# Patient Record
Sex: Male | Born: 1980 | Race: White | Hispanic: Yes | Marital: Single | Smoking: Never smoker
Health system: Southern US, Community
[De-identification: ages and names within clinical notes are randomized; demographics above are authoritative.]

---

## 2004-05-12 ENCOUNTER — Emergency Department (HOSPITAL_COMMUNITY): Admission: EM | Admit: 2004-05-12 | Discharge: 2004-05-12 | Payer: Self-pay | Admitting: Emergency Medicine

## 2014-10-27 ENCOUNTER — Ambulatory Visit (INDEPENDENT_AMBULATORY_CARE_PROVIDER_SITE_OTHER): Payer: Self-pay | Admitting: Emergency Medicine

## 2014-10-27 VITALS — BP 120/86 | HR 77 | Temp 98.1°F | Resp 17 | Ht 65.25 in | Wt 162.0 lb

## 2014-10-27 DIAGNOSIS — R21 Rash and other nonspecific skin eruption: Secondary | ICD-10-CM

## 2014-10-27 LAB — GLUCOSE, POCT (MANUAL RESULT ENTRY): POC Glucose: 103 mg/dl — AB (ref 70–99)

## 2014-10-27 MED ORDER — CLOTRIMAZOLE 1 % EX CREA: 1.0000 "application " | TOPICAL_CREAM | Freq: Two times a day (BID) | CUTANEOUS | Status: DC

## 2014-10-27 MED ORDER — CETIRIZINE HCL 10 MG PO TABS: 10.0000 mg | ORAL_TABLET | Freq: Every day | ORAL | Status: DC

## 2014-10-27 NOTE — Patient Instructions (Signed)
Prurito en la Ingle (Jock Itch) Prurito en la ingle es una infeccin por hongos en la piel de la ingle. A veces se lo denomina "culebrilla" pero no proviene de un gusano. Un hongo es un tipo de germen que crece en lugares oscuros y hmedos.  CAUSAS La infeccin podr diseminarse:  Por infeccin de hongos en cualquier parte del cuerpo (como el pie de atleta).  Compartir toallas o ropa. Esta infeccin es ms frecuente en:  Climas clidos y hmedos.  Personas que Lao People's Democratic Republicutilizan ropa Indonesiaajustada o trajes de bao hmedos por Con-waymucho tiempo.  Deportistas.  Personas con sobrepeso.  Personas con diabtes. SNTOMAS Prurito en la ingle causa los siguientes sntomas:  Puntos rojos, rosados o marrones en la ingle. Puede expandirse a los muslos, nalgas y ano.  Picazn. DIAGNSTICO El profesional hace el diagnstico a travs de la observacin de la erupcin. A veces se realiza un raspado de la piel para realizar un anlisis. El anlisis se obtiene mirando el microscopio o mediante un cultivo (prueba para Therapist, musichacer crecer el hongo). Este puede tomar Liberty Mutualhasta dos semanas en reaparecer. TRATAMIENTO Prurito en la ingle puede tratarse:  Cremas para la piel o pomadas para matar hongos.  Medicamentos por va oral que destruyen los hongos.  Cremas para la piel o pomadas para calmar la picazn.  Compresas o polvos con medicamentos para secar la piel infectada. INSTRUCCIONES PARA EL CUIDADO DOMICILIARIO  Asegrese de que la erupcin desaparezca completamente con el tratamiento. Siga las indicaciones del mdico. Puede llevar un par de semanas completar la curacin. Si no se trata durante el tiempo suficiente, esta lesin Metallurgistpuede reaparecer.  Use ropas sueltas.  Los hombres deben usar boxers de algodn cortos.  Las mujeres deben usar ropa interior de algodn.  Evite los baos calientes.  Seque la zona de la ingle luego del bao. SOLICITE ANTENCIN MDICA SI:  La erupcin empeora.  Se extiende.  Aparece  nuevamente luego de completar el tratamiento.  No se cura en el trmino de 4 semanas. Las infecciones micticas son lentas en responder al TEFL teachertratamiento. Persiste un enrojecimiento durante varias semanas despus de que el hongo haya desaparecido. SOLICITE ATENCIN MDICA DE INMEDIATO SI:  La zona se vuelve roja, caliente, se hincha o duele.  Tiene fiebre. Document Released: 06/20/2005 Document Revised: 12/03/2011 [facility] Center[facility]Exit[facility] Patient Information 2015 QuartzsiteExitCare, MarylandLLC. This information is not intended to replace advice given to you by your health care provider. Make sure you discuss any questions you have with your health care provider.

## 2014-10-27 NOTE — Progress Notes (Signed)
   Subjective:    Patient ID: Kevin Contreras, male    DOB: 09/29/1980, 34 y.o.   MRN: [id number] This chart was scribed for Kevin ChrisSteven Ortha Metts, MD by Littie Deedsichard Sun, Medical Scribe. This patient was seen in room 5 and the patient's care was started at 11:28 AM.   HPI HPI Comments: Kevin Elkngel Guillen Phegley is a 34 y.o. Spanish-speaking male who presents to the [facility] complaining of an itching rash to his scrotum that started 1 week ago. He has not tried any medications. He denies sore throat. Patient denies having similar rash before. He is unsure if he has DM.    Review of Systems  HENT: Negative for sore throat.   Skin: Positive for rash.       Objective:   Physical Exam CONSTITUTIONAL: Well developed/well nourished HEAD: Normocephalic/atraumatic EYES: EOM/PERRL ENMT: Mucous membranes moist NECK: supple no meningeal signs SPINE: entire spine nontender CV: S1/S2 noted, no murmurs/rubs/gallops noted LUNGS: Lungs are clear to auscultation bilaterally, no apparent distress ABDOMEN: soft, nontender, no rebound or guarding GU: Mild redness of the scrotal sac. Testes are normal. No discharge or adenopathy. NEURO: Pt is awake/alert, moves all extremitiesx4 EXTREMITIES: pulses normal, full ROM SKIN: warm, color normal PSYCH: no abnormalities of mood noted Results for orders placed or performed in visit on 10/27/14  POCT glucose (manual entry)  Result Value Ref Range   POC Glucose 103 (A) 70 - 99 mg/dl         Assessment & Plan:  Patient having a lot of itching with redness of the scrotum. Will treat with Zyrtec and Lotrimin cream. He is not diabetic. This most likely represents a form of tinea cruris.I personally performed the services described in this documentation, which was scribed in my presence. The recorded information has been reviewed and is accurate.

## 2015-08-28 ENCOUNTER — Emergency Department (HOSPITAL_COMMUNITY)
Admission: EM | Admit: 2015-08-28 | Discharge: 2015-08-28 | Disposition: A | Discharge status: Home / Self Care | Payer: Self-pay | Attending: Emergency Medicine | Admitting: Emergency Medicine

## 2015-08-28 ENCOUNTER — Other Ambulatory Visit: Payer: Self-pay

## 2015-08-28 ENCOUNTER — Emergency Department (HOSPITAL_COMMUNITY): Payer: Self-pay

## 2015-08-28 ENCOUNTER — Encounter (HOSPITAL_COMMUNITY): Payer: Self-pay | Admitting: Emergency Medicine

## 2015-08-28 DIAGNOSIS — R42 Dizziness and giddiness: Secondary | ICD-10-CM | POA: Insufficient documentation

## 2015-08-28 DIAGNOSIS — R079 Chest pain, unspecified: Secondary | ICD-10-CM | POA: Insufficient documentation

## 2015-08-28 DIAGNOSIS — R110 Nausea: Secondary | ICD-10-CM | POA: Insufficient documentation

## 2015-08-28 LAB — CBC
HCT: 47.9 % (ref 39.0–52.0)
Hemoglobin: 17 g/dL (ref 13.0–17.0)
MCH: 30.8 pg (ref 26.0–34.0)
MCHC: 35.5 g/dL (ref 30.0–36.0)
MCV: 86.8 fL (ref 78.0–100.0)
PLATELETS: 305 10*3/uL (ref 150–400)
RBC: 5.52 MIL/uL (ref 4.22–5.81)
RDW: 12 % (ref 11.5–15.5)
WBC: 7.9 10*3/uL (ref 4.0–10.5)

## 2015-08-28 LAB — I-STAT TROPONIN, ED: TROPONIN I, POC: 0 ng/mL (ref 0.00–0.08)

## 2015-08-28 LAB — BASIC METABOLIC PANEL
Anion gap: 6 (ref 5–15)
BUN: 8 mg/dL (ref 6–20)
CALCIUM: 9.4 mg/dL (ref 8.9–10.3)
CHLORIDE: 102 mmol/L (ref 101–111)
CO2: 31 mmol/L (ref 22–32)
CREATININE: 0.93 mg/dL (ref 0.61–1.24)
Glucose, Bld: 161 mg/dL — ABNORMAL HIGH (ref 65–99)
Potassium: 3.4 mmol/L — ABNORMAL LOW (ref 3.5–5.1)
SODIUM: 139 mmol/L (ref 135–145)

## 2015-08-28 MED ORDER — GI COCKTAIL ~~LOC~~
30.0000 mL | Freq: Once | ORAL | Status: AC
  Administered 2015-08-28: 30 mL via ORAL
  Filled 2015-08-28: qty 30

## 2015-08-28 MED ORDER — IOHEXOL 350 MG/ML SOLN
100.0000 mL | Freq: Once | INTRAVENOUS | Status: AC | PRN
  Administered 2015-08-28: 100 mL via INTRAVENOUS

## 2015-08-28 NOTE — ED Provider Notes (Signed)
CSN: [id number]     Arrival date & time 08/28/15  2054 History   First MD Initiated Contact with Patient 08/28/15 2111     Chief Complaint  Patient presents with  . Chest Pain     (Consider location/radiation/quality/duration/timing/severity/associated sxs/prior Treatment) Patient is a 34 y.o. male presenting with chest pain. The history is provided by the patient.  Chest Pain Pain location:  Epigastric Pain quality: sharp   Pain radiates to:  Does not radiate Pain radiates to the back: no   Pain severity:  Severe Onset quality:  Gradual Duration:  2 days Timing:  Intermittent Progression:  Waxing and waning Chronicity:  New Context: eating   Relieved by:  Nothing Worsened by:  Nothing tried Ineffective treatments:  None tried Associated symptoms: dizziness and nausea   Associated symptoms: no abdominal pain, no fever, no headache, no palpitations, no shortness of breath and not vomiting   Risk factors: male sex   Risk factors: no coronary artery disease, no diabetes mellitus, no high cholesterol, no hypertension and no smoking     34 yo M with a chief complaint of substernal chest pain. This started on Monday. Patient states it's intermittent. Some times associated with food. Patient also has it happened usually on exertion. Gets very dizzy weak with it. Patient has never had any symptoms like this before. Patient does not normally see a doctor. Denies any medical history. No family history of MI.  History reviewed. No pertinent past medical history. History reviewed. No pertinent past surgical history. No family history on file. Social History  Substance Use Topics  . Smoking status: Never Smoker   . Smokeless tobacco: None  . Alcohol Use: No    Review of Systems  Constitutional: Negative for fever and chills.  HENT: Negative for congestion and facial swelling.   Eyes: Negative for discharge and visual disturbance.  Respiratory: Negative for shortness of breath.    Cardiovascular: Positive for chest pain. Negative for palpitations.  Gastrointestinal: Positive for nausea. Negative for vomiting, abdominal pain and diarrhea.  Musculoskeletal: Negative for myalgias and arthralgias.  Skin: Negative for color change and rash.  Neurological: Positive for dizziness. Negative for tremors, syncope and headaches.  Psychiatric/Behavioral: Negative for confusion and dysphoric mood.      Allergies  Review of patient's allergies indicates no known allergies.  Home Medications   Prior to Admission medications   Medication Sig Start Date End Date Taking? Authorizing Provider  cetirizine (ZYRTEC) 10 MG tablet Take 1 tablet (10 mg total) by mouth daily. Patient not taking: Reported on 08/28/2015 10/27/14   Collene Gobble, MD  clotrimazole (LOTRIMIN) 1 % cream Apply 1 application topically 2 (two) times daily. Patient not taking: Reported on 08/28/2015 10/27/14   Collene Gobble, MD   BP 136/82 mmHg  Pulse 87  Temp(Src) 98 F (36.7 C) (Oral)  Resp 17  SpO2 100% Physical Exam  Constitutional: He is oriented to person, place, and time. He appears well-developed and well-nourished.  HENT:  Head: Normocephalic and atraumatic.  Eyes: EOM are normal. Pupils are equal, round, and reactive to light.  Neck: Normal range of motion. Neck supple. No JVD present.  Cardiovascular: Normal rate, regular rhythm and intact distal pulses.  Exam reveals no gallop and no friction rub.   No murmur heard. Pulmonary/Chest: No respiratory distress. He has no wheezes.  Abdominal: He exhibits no distension. There is no tenderness. There is no rebound and no guarding.  Musculoskeletal: Normal range of motion.  Neurological: He is alert and oriented to person, place, and time.  Skin: No rash noted. No pallor.  Psychiatric: He has a normal mood and affect. His behavior is normal.  Nursing note and vitals reviewed.   ED Course  Procedures (including critical care time) Labs Review Labs  Reviewed  BASIC METABOLIC PANEL - Abnormal; Notable for the following:    Potassium 3.4 (*)    Glucose, Bld 161 (*)    All other components within normal limits  CBC  I-STAT TROPOININ, ED    Imaging Review Dg Chest 2 View  08/28/2015  CLINICAL DATA:  Centralized chest pain tonight. EXAM: CHEST  2 VIEW COMPARISON:  None. FINDINGS: Cardiomediastinal silhouette is normal. The lungs are clear without pleural effusions or focal consolidations. Trachea projects midline and there is no pneumothorax. Soft tissue planes and included osseous structures are non-suspicious. IMPRESSION: Normal chest. Electronically Signed   By: Courtnay  Bloomer M.D.   On: 08/28/2015 21:27   Ct Angio Chest Pe W/cm &/or Wo Cm  08/28/2015  CLINICAL DATA:  Acute onset of intermittent central chest pain and mild shortness of breath. Initial encounter. EXAM: CT ANGIOGRAPHY CHEST WITH CONTRAST TECHNIQUE: Multidetector CT imaging of the chest was performed using the standard protocol during bolus administration of intravenous contrast. Multiplanar CT image reconstructions and MIPs were obtained to evaluate the vascular anatomy.Technical [id number] KitTechnical [id number] KitcH. C. Watkins Memorial Technical bCind551-4575AdvanMarland KitcMary Bridge Children'S Hospital ATechnical bCind60471AdvanMarland KitcWeisman Childrens RehabilitatiTechnical [phone] KitcMunicipal Hosp & Granite ManorTechnical bCind386-0842AdvanMarland KitcPark City MediTechnical bCind53175AdvanMarland KitcBoundary Community Hospitalhen6Huston FoleSw ElmoreTechnicalWillapa Harbor HoTechnical [phone] KitcSt Marys Ambulatory Surgery Centerhen2HuTechnical bCind475-8224AdvanMarland KitcNorth Central Bronx Hospitalhen6[place] FTechnical [phone] KitcSt Mary MerTechnical [phone] KitcUnasource Technical bCind909-113AdvanMarland KitcBeacon Behavioral Hospital NorthshoTechnical [phone] KitcProvident HospiTechnical [phone] KitcVa Medical Center - Fort MeadTechnical [phone] KitcCape Surgery CTechnical [phone] KitcEl Dorado STechnical [phone] KitcOceans Behavioral Hospital Of The Permian Basinhen(6HTechnical bCind(364TechnicalFTechnical bCind636-428AdvanMarland KitcWest Paces MedicaTechnical [phone] KitcCenter For Orthopedic Surgery LLChen(2Huston FoleSwa13moiKore01308(MBecton, Dickinson and CompanyDonatMarykayJessDupage Eye Surgery Center p.m. FINDINGS: There is no evidence of pulmonary embolus. The lungs are grossly clear bilaterally. There is no evidence of significant focal consolidation, pleural effusion or pneumothorax. No masses are identified; no abnormal focal contrast enhancement is seen. The mediastinum is unremarkable appearance. No mediastinal lymphadenopathy is seen. No pericardial effusion is identified. The great vessels are grossly unremarkable. No axillary lymphadenopathy is seen. The thyroid gland is unremarkable in appearance. The visualized portions of the liver and spleen are unremarkable. No acute osseous abnormalities are seen. Review of the MIP images confirms the above findings. IMPRESSION: 1. No evidence of  pulmonary embolus. 2. Lungs clear bilaterally. Electronically Signed   By: Jeffery  Chang M.D.   On: 08/28/2015 22:27   I have personally reviewed and evaluated these images and lab results as part of my medical decision-making.  ED ECG REPORT   Date: 08/28/2015  Rate: 94  Rhythm: normal sinus rhythm  QRS Axis: right  Intervals: normal  ST/T Wave abnormalities: normal  Conduction Disutrbances:none  Narrative Interpretation:   Old EKG Reviewed: none available  I have personally reviewed the EKG tracing and agree with the computerized printout as noted.  MDM   Final diagnoses:  Chest pain, unspecified chest pain type    34 yo M with a chief complaint of chest pain. This been going on for the past 4 days. Pain is intermittent and not constant. Due to leg which barrier possible limitation to history. Patient had transient tachycardia walls in the room with some symptoms consistent with pulmonary embolism CT angios ordered. Feel that patient is unlikely to have an MI due to age, with normal ecg, normal cxr and negative trop ordered from triage.   11:04 PM:  I have discussed the diagnosis/risks/treatment options with the patient and family and believe the pt to be eligible for discharge home to follow-up with PCP. We also discussed returning to the ED immediately if new or worsening sx occur. We discussed the sx which are most concerning (e.g., sudden worsening sob, chest pain, syncope) that  necessitate immediate return. Medications administered to the patient during their visit and any new prescriptions provided to the patient are listed below.  Medications given during this visit Medications  iohexol (OMNIPAQUE) 350 MG/ML injection 100 mL (100 mLs Intravenous Contrast Given 08/28/15 2207)  gi cocktail (Maalox,Lidocaine,Donnatal) (30 mLs Oral Given 08/28/15 2243)    Discharge Medication List as of 08/28/2015 10:37 PM      The patient appears reasonably screen and/or stabilized for  discharge and I doubt any other medical condition or other [facility] requiring further screening, evaluation, or treatment in the ED at this time prior to discharge.      Melene Plan, DO 08/28/15 2304

## 2015-08-28 NOTE — Discharge Instructions (Signed)
Try zantac twice a day.  Dolor de pecho inespecfico  (Nonspecific Chest Pain) El dolor de pecho puede deberse a muchas enfermedades diferentes. Siempre existe una posibilidad de que el dolor est relacionado con algo grave, como un infarto de miocardio o un cogulo sanguneo en los pulmones. Hay muchas enfermedades que no son potencialmente mortales que pueden causar dolor de Onargapecho. Si tiene Engineer, miningdolor de Hotel managerpecho, es muy importante que se controle con el mdico. CAUSAS  Las causas del dolor de pecho pueden ser las siguientes:  Acidez estomacal.  Neumona o bronquitis.  Ansiedad o estrs.  Inflamacin de la zona que rodea al corazn (pericarditis) o a los pulmones (pleuritis o pleuresa).  Un cogulo sanguneo en el pulmn.  Colapso de un pulmn (neumotrax), que puede aparecer de Regions Financial Corporationmanera repentina por s solo (neumotrax espontneo) o debido a un traumatismo en el trax.  Culebrilla (virus de la varicela zster).  Infarto de miocardio.  Dao de los Oberlinhuesos, los msculos y los cartlagos que conforman la pared torcica. Esto puede incluir lo siguiente:  Hematomas seos debido a lesiones.  Distensiones musculares o de los cartlagos por tos frecuente o repetida, o por exceso de trabajo.  Fractura de una o ms costillas.  Dolor de TEFL teachercartlago debido a inflamacin (costocondritis). FACTORES DE RIESGO  Los factores de riesgo de tener dolor de pecho pueden incluir lo siguiente:  Actividades que incrementan el riesgo de sufrir traumatismos o lesiones en el trax.  Infecciones o enfermedades respiratorias que causan tos frecuente.  Enfermedades o Eastman Kodakexcesos en las comidas que pueden causar Engineering geologistacidez.  Enfermedades cardacas o antecedentes familiares de enfermedades cardacas.  Enfermedades o comportamientos de salud que aumentan el riesgo de tener un cogulo sanguneo.  Haber tenido varicela (varicela zster). SIGNOS Y SNTOMAS El dolor de pecho puede provocar las siguientes  sensaciones:  Ardor u hormigueo en la superficie o en lo profundo del pecho.  Dolor opresivo, continuo o constrictivo.  Dolor vago o intenso que empeora al Clorox Companymoverse, toser o inhalar profundamente.  Dolor que tambin se siente en la espalda, el cuello, el hombro o el brazo, o dolor que se irradia a cualquiera de estas zonas. El dolor de pecho puede aparecer y Geneticist, moleculardesaparecer, o bien puede ser constante. DIAGNSTICO Gretchen ShortQuizs se necesiten anlisis de laboratorio u otros estudios para Veterinary surgeonencontrar la causa del Engineer, miningdolor. El mdico puede indicarle que se haga una prueba llamada EGC (electrocadiograma) ambulatorio. El Regulatory affairs officerelectrocardiograma registra los patrones de los latidos cardacos en el momento en que se realiza el Red Bluffestudio. Tambin pueden hacerle otros estudios, por ejemplo:  Ecocardiograma transtorcico (ETT). Durante el ecocardiograma, se usan ondas sonoras para crear una imagen de todas las estructuras cardacas y evaluar cmo circula la sangre por el corazn.  Ecocardiograma transesofgico (ETE).Este es un estudio de diagnstico por imgenes ms avanzado que el obtiene imgenes del interior del cuerpo. Le permite al mdico ver el corazn con mayor detalle.  Monitoreo cardaco. Permite que el mdico controle la frecuencia y el ritmo cardaco en tiempo real.  Monitor Holter. Es un dispositivo porttil que eBayregistra los latidos del corazn y puede ayudar a Education administratordiagnosticar las arritmias cardacas. Le permite al American Expressmdico registrar la actividad cardaca durante varios das, si es necesario.  Pruebas de esfuerzo. Estas pueden realizarse durante el ejercicio o mediante la administracin de un medicamento que acelera los latidos del corazn.  Anlisis de Twiningsangre.  Diagnstico por imgenes. TRATAMIENTO  El tratamiento depende de la causa del dolor de Fincastlepecho. El tratamiento puede incluir lo siguiente:  Medicamentos.  Estos pueden incluir lo siguiente:  Inhibidores de Engineer, petroleum.  Antiinflamatorios.  Analgsicos para las enfermedades inflamatorias.  Antibiticos, si hay una infeccin.  Medicamentos para [employer].  Medicamentos para tratar la enfermedad arterial coronaria.  Tratamiento complementario para las enfermedades que no requieren la toma de medicamentos. Esto puede incluir lo siguiente:  Descansar.  Aplicar compresas fras o calientes en las zonas lesionadas.  Limitar las actividades hasta que Erie Insurance Group. INSTRUCCIONES PARA EL CUIDADO EN EL HOGAR  Si le recetaron antibiticos, asegrese de terminarlos, incluso si comienza a sentirse mejor.  Evite las SUPERVALU INC causen dolor de Oracle.  No consuma ningn producto que contenga tabaco, lo que incluye cigarrillos, tabaco de Theatre manager o Administrator, Civil Service. Si necesita ayuda para dejar de fumar, consulte al mdico.  No beba alcohol.  Tome los medicamentos solamente como se lo haya indicado el mdico.  Concurra a todas las visitas de control como se lo haya indicado el mdico. Esto es importante. Esto incluye otros estudios si el dolor de pecho no desaparece.  Si la acidez es la causa del dolor de [place], tal vez le aconsejen que mantenga la cabeza levantada (elevada) mientras duerme. Esto reduce la probabilidad de que el cido retroceda del estmago al esfago.  Haga cambios en su estilo de vida como se lo haya indicado el mdico. Estos pueden incluir lo siguiente:  Education administrator actividad fsica con regularidad. Pida al mdico que le sugiera algunas actividades que sean seguras para usted.  Consumir una dieta cardiosaludable. Un nutricionista matriculado puede ayudarlo a Software engineer saludables.  Mantener un peso saludable.  Controlar la diabetes, si es necesario.  Reducir las situaciones de estrs. SOLICITE ATENCIN MDICA SI:  El dolor de pecho no desaparece despus del tratamiento.  Tiene una erupcin cutnea con ampollas en el  pecho.  Tiene fiebre. SOLICITE ATENCIN MDICA DE INMEDIATO SI:   El dolor en el pecho es ms intenso.  La tos empeora, o expectora sangre.  Siente un dolor abdominal intenso.  Siente debilidad intensa.  Se desmaya.  Tiene escalofros.  Tiene una molestia repentina e inexplicable en el pecho.  Tiene molestias repentinas e Exxon Mobil Corporation, la espalda, el cuello o la [place].  Le falta el aire en cualquier momento.  Comienza a sudar de [place] repentina o la piel se le humedece.  Siente nuseas o vomita.  Se siente repentinamente mareado o se desmaya.  Siente que el corazn comienza a latir rpidamente o que se saltea latidos. Estos sntomas pueden representar un problema grave que constituye Radio broadcast assistant. No espere hasta que los sntomas desaparezcan. Solicite atencin mdica de inmediato. Comunquese con el servicio de emergencias de su localidad (911 en los [place]). No conduzca por sus propios medios OfficeMax Incorporated.   Esta informacin no tiene Theme park manager el consejo del mdico. Asegrese de hacerle al mdico cualquier pregunta que tenga.   Document Released: 09/10/2005 Document Revised: 10/01/2014 Elsevier Interactive Patient Education [employer]

## 2015-08-28 NOTE — ED Notes (Signed)
Pt. reports intermittent central chest pain onset Monday with mild SOB , denies nausea or diaphoresis , no cough or congestion .

## 2017-03-07 ENCOUNTER — Encounter (HOSPITAL_COMMUNITY): Payer: Self-pay | Admitting: *Deleted

## 2017-03-07 ENCOUNTER — Other Ambulatory Visit: Payer: Self-pay

## 2017-03-07 ENCOUNTER — Emergency Department (HOSPITAL_COMMUNITY)
Admission: EM | Admit: 2017-03-07 | Discharge: 2017-03-07 | Disposition: A | Discharge status: Home / Self Care | Payer: Self-pay | Attending: Emergency Medicine | Admitting: Emergency Medicine

## 2017-03-07 ENCOUNTER — Emergency Department (HOSPITAL_COMMUNITY): Payer: Self-pay

## 2017-03-07 DIAGNOSIS — M940 Chondrocostal junction syndrome [Tietze]: Secondary | ICD-10-CM | POA: Insufficient documentation

## 2017-03-07 DIAGNOSIS — R0789 Other chest pain: Secondary | ICD-10-CM | POA: Insufficient documentation

## 2017-03-07 DIAGNOSIS — Z79899 Other long term (current) drug therapy: Secondary | ICD-10-CM | POA: Insufficient documentation

## 2017-03-07 LAB — BASIC METABOLIC PANEL
ANION GAP: 11 (ref 5–15)
BUN: 9 mg/dL (ref 6–20)
CO2: 24 mmol/L (ref 22–32)
Calcium: 9.2 mg/dL (ref 8.9–10.3)
Chloride: 104 mmol/L (ref 101–111)
Creatinine, Ser: 1.06 mg/dL (ref 0.61–1.24)
GFR calc Af Amer: >60 mL/min (ref >60)
GLUCOSE: 135 mg/dL — AB (ref 65–99)
POTASSIUM: 3.3 mmol/L — AB (ref 3.5–5.1)
Sodium: 139 mmol/L (ref 135–145)

## 2017-03-07 LAB — D-DIMER, QUANTITATIVE (NOT AT ARMC)

## 2017-03-07 LAB — CBC
HEMATOCRIT: 42.7 % (ref 39.0–52.0)
HEMOGLOBIN: 14.9 g/dL (ref 13.0–17.0)
MCH: 30.4 pg (ref 26.0–34.0)
MCHC: 34.9 g/dL (ref 30.0–36.0)
MCV: 87.1 fL (ref 78.0–100.0)
Platelets: 292 10*3/uL (ref 150–400)
RBC: 4.9 MIL/uL (ref 4.22–5.81)
RDW: 12.4 % (ref 11.5–15.5)
WBC: 9.5 10*3/uL (ref 4.0–10.5)

## 2017-03-07 LAB — I-STAT TROPONIN, ED: TROPONIN I, POC: 0 ng/mL (ref 0.00–0.08)

## 2017-03-07 LAB — TROPONIN I: Troponin I: <0.03 ng/mL (ref <0.03)

## 2017-03-07 MED ORDER — LORAZEPAM 2 MG/ML IJ SOLN
1.0000 mg | Freq: Once | INTRAMUSCULAR | Status: AC
  Administered 2017-03-07: 1 mg via INTRAVENOUS
  Filled 2017-03-07: qty 1

## 2017-03-07 MED ORDER — SODIUM CHLORIDE 0.9 % IV BOLUS (SEPSIS)
1000.0000 mL | Freq: Once | INTRAVENOUS | Status: AC
  Administered 2017-03-07: 1000 mL via INTRAVENOUS

## 2017-03-07 MED ORDER — CYCLOBENZAPRINE HCL 10 MG PO TABS: 10.0000 mg | ORAL_TABLET | Freq: Three times a day (TID) | ORAL | 0 refills | Status: DC | PRN

## 2017-03-07 MED ORDER — NAPROXEN 500 MG PO TABS
500.0000 mg | ORAL_TABLET | Freq: Two times a day (BID) | ORAL | 0 refills | Status: AC
Start: 2017-03-07 — End: 2017-03-14

## 2017-03-07 MED ORDER — HYDROCODONE-ACETAMINOPHEN 5-325 MG PO TABS: 1.0000 | ORAL_TABLET | ORAL | 0 refills | Status: DC | PRN

## 2017-03-07 MED ORDER — KETOROLAC TROMETHAMINE 15 MG/ML IJ SOLN
15.0000 mg | Freq: Once | INTRAMUSCULAR | Status: AC
  Administered 2017-03-07: 15 mg via INTRAVENOUS
  Filled 2017-03-07: qty 1

## 2017-03-07 NOTE — ED Triage Notes (Signed)
The pt is c/o central chest pain for one year  He has been here several times for the same and he reports they never gave me nothing    No other symptoms associated with this pain

## 2017-03-07 NOTE — ED Notes (Signed)
Pt verbalized understanding discharge instructions and denies any further needs or questions at this time. VS stable, ambulatory and steady gait.   

## 2017-03-07 NOTE — ED Provider Notes (Signed)
[facility]-EMERGENCY DEPT Provider Note   CSN: [id number] Arrival date & time: 03/07/17  1920     History   Chief Complaint Chief Complaint  Patient presents with  . Chest Pain    HPI Kevin Contreras is a 36 y.o. male.  HPI   36 yo M here with left sided CP. Pt does frequent lifting/bending at work. He reports sharp, positional, left sided chest pain with associated "twinging" pain in his left chest. He has associated mild pain with deep inspiration but also pain with palpation. No SOB, nausea. No diaphoresis. No personal or family h/o DVT or PE. No leg swelling or recent surgeries. No other medical complaints.  History reviewed. No pertinent past medical history.  There are no active problems to display for this patient.   History reviewed. No pertinent surgical history.     Home Medications    Prior to Admission medications   Medication Sig Start Date End Date Taking? Authorizing Provider  cetirizine (ZYRTEC) 10 MG tablet Take 1 tablet (10 mg total) by mouth daily. Patient not taking: Reported on 03/07/2017 10/27/14   Collene Gobble, MD  clotrimazole (LOTRIMIN) 1 % cream Apply 1 application topically 2 (two) times daily. Patient not taking: Reported on 08/28/2015 10/27/14   Collene Gobble, MD  cyclobenzaprine (FLEXERIL) 10 MG tablet Take 1 tablet (10 mg total) by mouth 3 (three) times daily as needed for muscle spasms. 03/07/17   Shaune Pollack, MD  HYDROcodone-acetaminophen (NORCO/VICODIN) 5-325 MG tablet Take 1-2 tablets by mouth every 4 (four) hours as needed for moderate pain or severe pain. 03/07/17   Shaune Pollack, MD  naproxen (NAPROSYN) 500 MG tablet Take 1 tablet (500 mg total) by mouth 2 (two) times daily with a meal. 03/07/17 03/14/17  Shaune Pollack, MD    Family History No family history on file.  Social History Social History  Substance Use Topics  . Smoking status: Never Smoker  . Smokeless tobacco: Never Used  . Alcohol use No     Allergies     Patient has no known allergies.   Review of Systems Review of Systems  Constitutional: Negative for chills, fatigue and fever.  HENT: Negative for congestion and rhinorrhea.   Eyes: Negative for visual disturbance.  Respiratory: Negative for cough, shortness of breath and wheezing.   Cardiovascular: Positive for chest pain. Negative for leg swelling.  Gastrointestinal: Negative for abdominal pain, diarrhea, nausea and vomiting.  Genitourinary: Negative for dysuria and flank pain.  Musculoskeletal: Negative for neck pain and neck stiffness.  Skin: Negative for rash and wound.  Allergic/Immunologic: Negative for immunocompromised state.  Neurological: Negative for syncope, weakness and headaches.  All other systems reviewed and are negative.    Physical Exam Updated Vital Signs BP 119/69   Pulse 88   Temp 99.1 F (37.3 C) (Oral)   Resp 16   Ht 5\' 6"  (1.676 m)   Wt 72.6 kg (160 lb)   SpO2 99%   BMI 25.82 kg/m   Physical Exam  Constitutional: He is oriented to person, place, and time. He appears well-developed and well-nourished. No distress.  HENT:  Head: Normocephalic and atraumatic.  Eyes: Conjunctivae are normal.  Neck: Neck supple.  Cardiovascular: Normal rate, regular rhythm and normal heart sounds.  Exam reveals no friction rub.   No murmur heard. Pulmonary/Chest: Effort normal and breath sounds normal. No respiratory distress. He has no wheezes. He has no rales. He exhibits tenderness (significant pinpoint TTP over left upper chest intercostal  spaces).  Abdominal: He exhibits no distension.  Musculoskeletal: He exhibits no edema.  Neurological: He is alert and oriented to person, place, and time. He exhibits normal muscle tone.  Skin: Skin is warm. Capillary refill takes less than 2 seconds.  Psychiatric: He has a normal mood and affect.  Nursing note and vitals reviewed.    ED Treatments / Results  Labs (all labs ordered are listed, but only abnormal  results are displayed) Labs Reviewed  BASIC METABOLIC PANEL - Abnormal; Notable for the following:       Result Value   Potassium 3.3 (*)    Glucose, Bld 135 (*)    All other components within normal limits  CBC  TROPONIN I  D-DIMER, QUANTITATIVE (NOT AT [facility])  Rosezena SensorI-STAT TROPOININ, ED    EKG  EKG Interpretation  Date/Time:  Thursday March 07 2017 19:25:14 EDT Ventricular Rate:  109 PR Interval:  138 QRS Duration: 80 QT Interval:  324 QTC Calculation: 436 R Axis:   94 Text Interpretation:  Sinus tachycardia Possible Left atrial enlargement Rightward axis Borderline ECG No significant change since last tracing Confirmed by Shaune PollackIsaacs, Bralee Feldt [phone]) on 03/08/2017 2:47:19 PM       Radiology Dg Chest 2 View  Result Date: 03/07/2017 CLINICAL DATA:  Chest pain.  Shortness of breath. EXAM: CHEST  2 VIEW COMPARISON:  08/28/2015 FINDINGS: The heart size and mediastinal contours are within normal limits. Both lungs are clear. The visualized skeletal structures are unremarkable. IMPRESSION: Normal exam. Electronically Signed   By: Kennith CenterEric  Mansell M.D.   On: 03/07/2017 19:59    Procedures Procedures (including critical care time)  Medications Ordered in ED Medications  sodium chloride 0.9 % bolus 1,000 mL (0 mLs Intravenous Stopped 03/07/17 2339)  ketorolac (TORADOL) 15 MG/ML injection 15 mg (15 mg Intravenous Given 03/07/17 2231)  LORazepam (ATIVAN) injection 1 mg (1 mg Intravenous Given 03/07/17 2231)     Initial Impression / Assessment and Plan / ED Course  I have reviewed the triage vital signs and the nursing notes.  Pertinent labs & imaging results that were available during my care of the patient were reviewed by me and considered in my medical decision making (see chart for details).     36 yo M here with atypical, positional, sharp, reproducible left sided chest pain consistent with likely MSK chest wall pain/costochondritis. Trop neg x 2, EKG non-ischemic, and HEART score <3 - do  not suspect ACS. D-Dimer neg, no signs of hypoxia or DVT, doubt DVT/PE. Pain not c/w dissection or esophageal perforation. Will d/c with analgesia, anti-inflammatories, and outpt follow-up.  Final Clinical Impressions(s) / ED Diagnoses   Final diagnoses:  Atypical chest pain  Costochondritis    New Prescriptions Discharge Medication List as of 03/07/2017 11:20 PM    START taking these medications   Details  cyclobenzaprine (FLEXERIL) 10 MG tablet Take 1 tablet (10 mg total) by mouth 3 (three) times daily as needed for muscle spasms., Starting Thu 03/07/2017, Print    HYDROcodone-acetaminophen (NORCO/VICODIN) 5-325 MG tablet Take 1-2 tablets by mouth every 4 (four) hours as needed for moderate pain or severe pain., Starting Thu 03/07/2017, Print    naproxen (NAPROSYN) 500 MG tablet Take 1 tablet (500 mg total) by mouth 2 (two) times daily with a meal., Starting Thu 03/07/2017, Until Thu 03/14/2017, Print         Shaune PollackIsaacs, Mclean Moya, MD 03/08/17 [phone]

## 2017-06-14 ENCOUNTER — Emergency Department (HOSPITAL_COMMUNITY)
Admission: EM | Admit: 2017-06-14 | Discharge: 2017-06-15 | Disposition: A | Discharge status: Home / Self Care | Payer: Self-pay | Attending: Emergency Medicine | Admitting: Emergency Medicine

## 2017-06-14 ENCOUNTER — Emergency Department (HOSPITAL_COMMUNITY): Payer: Self-pay

## 2017-06-14 ENCOUNTER — Encounter (HOSPITAL_COMMUNITY): Payer: Self-pay | Admitting: Emergency Medicine

## 2017-06-14 DIAGNOSIS — R0789 Other chest pain: Secondary | ICD-10-CM | POA: Insufficient documentation

## 2017-06-14 DIAGNOSIS — R51 Headache: Secondary | ICD-10-CM | POA: Insufficient documentation

## 2017-06-14 DIAGNOSIS — R519 Headache, unspecified: Secondary | ICD-10-CM

## 2017-06-14 LAB — BASIC METABOLIC PANEL
Anion gap: 8 (ref 5–15)
BUN: 10 mg/dL (ref 6–20)
CHLORIDE: 100 mmol/L — AB (ref 101–111)
CO2: 27 mmol/L (ref 22–32)
CREATININE: 0.98 mg/dL (ref 0.61–1.24)
Calcium: 9.1 mg/dL (ref 8.9–10.3)
GFR calc non Af Amer: >60 mL/min (ref >60)
Glucose, Bld: 141 mg/dL — ABNORMAL HIGH (ref 65–99)
Potassium: 3.7 mmol/L (ref 3.5–5.1)
Sodium: 135 mmol/L (ref 135–145)

## 2017-06-14 LAB — POCT I-STAT TROPONIN I
Troponin i, poc: 0 ng/mL (ref 0.00–0.08)
Troponin i, poc: 0 ng/mL (ref 0.00–0.08)

## 2017-06-14 LAB — CBC
HCT: 44.2 % (ref 39.0–52.0)
Hemoglobin: 16.1 g/dL (ref 13.0–17.0)
MCH: 30.8 pg (ref 26.0–34.0)
MCHC: 36.4 g/dL — ABNORMAL HIGH (ref 30.0–36.0)
MCV: 84.5 fL (ref 78.0–100.0)
PLATELETS: 277 10*3/uL (ref 150–400)
RBC: 5.23 MIL/uL (ref 4.22–5.81)
RDW: 12.3 % (ref 11.5–15.5)
WBC: 7.4 10*3/uL (ref 4.0–10.5)

## 2017-06-14 MED ORDER — SODIUM CHLORIDE 0.9 % IV BOLUS (SEPSIS)
1000.0000 mL | Freq: Once | INTRAVENOUS | Status: AC
  Administered 2017-06-14: 1000 mL via INTRAVENOUS

## 2017-06-14 MED ORDER — METOCLOPRAMIDE HCL 5 MG/ML IJ SOLN
10.0000 mg | Freq: Once | INTRAMUSCULAR | Status: AC
  Administered 2017-06-14: 10 mg via INTRAVENOUS
  Filled 2017-06-14: qty 2

## 2017-06-14 MED ORDER — KETOROLAC TROMETHAMINE 30 MG/ML IJ SOLN
30.0000 mg | Freq: Once | INTRAMUSCULAR | Status: AC
  Administered 2017-06-14: 30 mg via INTRAVENOUS
  Filled 2017-06-14: qty 1

## 2017-06-14 NOTE — ED Provider Notes (Signed)
[facility]-EMERGENCY DEPT Provider Note   CSN: [id number] Arrival date & time: 06/14/17  1625     History   Chief Complaint Chief Complaint  Patient presents with  . Headache  . Chest Pain    HPI Kevin Contreras is a 36 y.o. male.  The history is provided by the patient. The history is limited by a language barrier. A language interpreter was used.  Headache   This is a new problem. The current episode started more than 2 days ago. The problem occurs constantly. The problem has not changed since onset.The headache is associated with nothing. The pain is located in the occipital region. The quality of the pain is described as dull and throbbing. The pain is moderate. Radiates to: neck to top of head. He has tried nothing for the symptoms.   36 year old male who presents with headache. Reports 1 week of waxing and waning posterior headache that radiates from the neck to the top of the head. Typically worse in the morning, occasionally associated with nausea but no vomiting. No vision, speech changes, difficulty walking, focal numbness or weakness. No fevers or chills, trauma or injury. 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 Has taken Flexeril for pain, but has not had improvement. Also notes that when pain is severe, in the morning, he occasionally has sharp throbbing left-sided chest pain that is worse with certain positions, and improves when he lifts his arm above his head. Last for a few minutes before going away. It is not associated with syncope or near syncope, difficulty breathing, cough fevers or chills.  History reviewed. No pertinent past medical history.  There are no active problems to display for this patient.   History reviewed. No pertinent surgical history.     Home Medications    Prior to Admission medications   Not on File     Family History History reviewed. No pertinent family history.  Social History Social History  Substance Use Topics  . Smoking status: Never Smoker  . Smokeless tobacco: Never Used  . Alcohol use No     Allergies   Patient has no known allergies.   Review of Systems Review of Systems   Physical Exam Updated Vital Signs BP (!) 145/72 (BP Location: Right Arm)   Pulse 68   Temp 97.6 F (36.4 C) (Oral)   Resp 16   SpO2 96%   Physical Exam Physical Exam  Nursing note and vitals reviewed. Constitutional: Well developed, well nourished, non-toxic, and in no acute distress Head: Normocephalic and atraumatic.  Mouth/Throat: Oropharynx is clear and moist.  Neck: Normal range of motion. Neck supple. no nuchal rigidity Cardiovascular: Normal rate and regular rhythm.   Pulmonary/Chest: Effort normal and breath sounds normal. Left sided chest pain with palpation Abdominal: Soft. There is no tenderness. There is no rebound and no guarding.  Musculoskeletal: Normal range of motion.  Skin: Skin is warm and dry.  Psychiatric: Cooperative Neurological:  Alert, oriented to person, place, time, and situation. Memory grossly in tact. Fluent speech. No dysarthria or aphasia.  Cranial nerves: VF are full. EOMI without nystagmus. No gaze deviation. Facial muscles symmetric with activation. Sensation to light touch over face in tact bilaterally. Hearing grossly in tact. Palate elevates symmetrically. Head turn and shoulder shrug are intact. Tongue midline.  Reflexes defered.  Muscle bulk and tone normal. No  pronator drift. Moves all extremities symmetrically. Sensation to light touch is in tact throughout in bilateral upper and lower extremities. Coordination reveals no dysmetria with finger to nose. Gait is narrow-based and steady. Non-ataxic.    ED Treatments / Results  Labs (all labs ordered are listed, but only abnormal results are displayed) Labs Reviewed  BASIC METABOLIC  PANEL - Abnormal; Notable for the following:       Result Value   Chloride 100 (*)    Glucose, Bld 141 (*)    All other components within normal limits  CBC - Abnormal; Notable for the following:    MCHC 36.4 (*)    All other components within normal limits  I-STAT TROPONIN, ED  POCT I-STAT TROPONIN I  I-STAT TROPONIN, ED  POCT I-STAT TROPONIN I    EKG  EKG Interpretation  Date/Time:  Friday June 14 2017 17:46:46 EDT Ventricular Rate:  88 PR Interval:    QRS Duration: 84 QT Interval:  349 QTC Calculation: 423 R Axis:   94 Text Interpretation:  Sinus rhythm Borderline right axis deviation Baseline wander in lead(s) V3 no acute ischemic changes Confirmed by Crista Curb [phone]) on 06/14/2017 10:02:48 PM       Radiology Dg Chest 2 View  Result Date: 06/14/2017 CLINICAL DATA:  Headache and neck pain EXAM: CHEST  2 VIEW COMPARISON:  03/07/2017 FINDINGS: The heart size and mediastinal contours are within normal limits. Both lungs are clear. The visualized skeletal structures are unremarkable. IMPRESSION: No active cardiopulmonary disease. Electronically Signed   By: Jasmine Pang M.D.   On: 06/14/2017 18:24   Ct Head Wo Contrast  Result Date: 06/14/2017 CLINICAL DATA:  Severe headache EXAM: CT HEAD WITHOUT CONTRAST TECHNIQUE: Contiguous axial images were obtained from the base of the skull through the vertex without intravenous contrast. COMPARISON:  None. FINDINGS: Brain: No evidence of acute infarction, hemorrhage, hydrocephalus, extra-axial collection or mass lesion/mass effect. Vascular: No hyperdense vessel or unexpected calcification. Skull: Normal. Negative for fracture or focal lesion. Sinuses/Orbits: No acute finding. Other: None IMPRESSION: No CT evidence for acute intracranial abnormality Electronically Signed   By: Jasmine Pang M.D.   On: 06/14/2017 22:38    Procedures Procedures (including critical care time)  Medications Ordered in ED Medications  ketorolac  (TORADOL) 30 MG/ML injection 30 mg (30 mg Intravenous Given 06/14/17 2149)  sodium chloride 0.9 % bolus 1,000 mL (0 mLs Intravenous Stopped 06/14/17 2235)  metoCLOPramide (REGLAN) injection 10 mg (10 mg Intravenous Given 06/14/17 2149)     Initial Impression / Assessment and Plan / ED Course  I have reviewed the triage vital signs and the nursing notes.  Pertinent labs & imaging results that were available during my care of the patient were reviewed by me and considered in my medical decision making (see chart for details).     Communication with patient through spanish interpreter.  36 year old male who presents with headache, lightheadedness for one week as well as chest pain. He is well-appearing in no acute distress with normal vital signs. He has a normal neurological exam. Unremarkable cardiopulmonary exam.  Chest pain seems musculoskeletal and is is fully reproducible with palpation and certain movements. EKG is nonischemic. Serum troponins are normal. Chest x-ray shows no acute cardiopulmonary processes. At this time no concerns for ACS, dissection, PE, or other serious intrathoracic etiologies of his symptoms.  He is a normal neurological exam, no nuchal rigidity. Suspect that his headache may be related to dehydration from working outside in [employer]  on a daily basis. He does have some concerning features such as headaches that are worse in the morning, occipital, and daily since onset. Subsequently a CT head was performed to rule out mass. This shows no acute intracranial processes. He is given a migraine cocktail, and feels symptoms fully resolved. Low suspicion for serious intracranial processes such as infection, stroke, hemorrhage, or other etiologies.  His blood work is reviewed and reassuring. Discuss continued supportive care for home. Strict return and follow-up instructions reviewed. He expressed understanding of all discharge instructions and felt comfortable with the plan  of care.   Final Clinical Impressions(s) / ED Diagnoses   Final diagnoses:  Occipital headache  Chest wall pain    New Prescriptions New Prescriptions   No medications on file     Lavera Guise, MD 06/14/17 2316

## 2017-06-14 NOTE — Discharge Instructions (Signed)
Drink plenty of fluids/keep hydrated, and take tylenol and/or ibuprofen as needed for pain.  Return without fail for worsening symptoms, including escalating pain, confusion, difficulty breathing or walking, passing out, or any other symptoms concerning to you.  Your blood work is reassuring. Your CT scan of the head is normal.

## 2017-06-14 NOTE — ED Triage Notes (Signed)
Pt reports HA and neck pain for the past week accompanied lightheadedness. No emesis. Pt also reports CP, no SOB. Denies any recent injury.  Triage done via spanish interpreter

## 2017-06-14 NOTE — ED Notes (Signed)
Bed: WA04 Expected date:  Expected time:  Means of arrival:  Comments: 

## 2017-06-15 ENCOUNTER — Encounter (HOSPITAL_COMMUNITY): Payer: Self-pay | Admitting: *Deleted

## 2017-07-02 ENCOUNTER — Encounter (HOSPITAL_COMMUNITY): Payer: Self-pay

## 2017-07-02 ENCOUNTER — Emergency Department (HOSPITAL_COMMUNITY)
Admission: EM | Admit: 2017-07-02 | Discharge: 2017-07-02 | Disposition: A | Discharge status: Home / Self Care | Payer: Self-pay | Attending: Emergency Medicine | Admitting: Emergency Medicine

## 2017-07-02 ENCOUNTER — Emergency Department (HOSPITAL_COMMUNITY): Payer: Self-pay

## 2017-07-02 DIAGNOSIS — R0789 Other chest pain: Secondary | ICD-10-CM | POA: Insufficient documentation

## 2017-07-02 DIAGNOSIS — Z79899 Other long term (current) drug therapy: Secondary | ICD-10-CM | POA: Insufficient documentation

## 2017-07-02 MED ORDER — IBUPROFEN 600 MG PO TABS: 600.0000 mg | ORAL_TABLET | Freq: Four times a day (QID) | ORAL | 0 refills | Status: DC | PRN

## 2017-07-02 MED ORDER — METHOCARBAMOL 750 MG PO TABS: 750.0000 mg | ORAL_TABLET | Freq: Four times a day (QID) | ORAL | 0 refills | Status: DC

## 2017-07-02 NOTE — ED Triage Notes (Signed)
Patient c/o mid chest pain that radiates to the mid back x 5 days. Patient denies any SOB, diaphoresis with chest pain. Patient c/o non productive cough x 1 day.

## 2017-07-02 NOTE — ED Provider Notes (Signed)
[facility]-EMERGENCY DEPT Provider Note   CSN: [id number] Arrival date & time: 07/02/17  1753     History   Chief Complaint Chief Complaint  Patient presents with  . Chest Pain    HPI Kevin Contreras is a 36 y.o. male.  36 year old male presents with several days of constant sharp back pain that radiates to his anterior chest. Pain is worse with movement and not associated with cough or congestion. He does work and Youth worker. States that symptoms better with activity and worse with remaining still. Denies any associated dyspnea or diaphoresis. Has been using ibuprofen with temporary relief. No prior history of same.      History reviewed. No pertinent past medical history.  There are no active problems to display for this patient.   History reviewed. No pertinent surgical history.     Home Medications    Prior to Admission medications   Medication Sig Start Date End Date Taking? Authorizing Provider  cetirizine (ZYRTEC) 10 MG tablet Take 1 tablet (10 mg total) by mouth daily. Patient not taking: Reported on 03/07/2017 10/27/14   Collene Gobble, MD  clotrimazole (LOTRIMIN) 1 % cream Apply 1 application topically 2 (two) times daily. Patient not taking: Reported on 08/28/2015 10/27/14   Collene Gobble, MD  cyclobenzaprine (FLEXERIL) 10 MG tablet Take 1 tablet (10 mg total) by mouth 3 (three) times daily as needed for muscle spasms. 03/07/17   Shaune Pollack, MD  HYDROcodone-acetaminophen (NORCO/VICODIN) 5-325 MG tablet Take 1-2 tablets by mouth every 4 (four) hours as needed for moderate pain or severe pain. 03/07/17   Shaune Pollack, MD    Family History History reviewed. No pertinent family history.  Social History Social History  Substance Use Topics  . Smoking status: Never Smoker  . Smokeless tobacco: Never Used  . Alcohol use No     Allergies   Patient has no known allergies.   Review of Systems Review of Systems  All other systems reviewed and are  negative.    Physical Exam Updated Vital Signs BP 132/78 (BP Location: Right Arm)   Pulse 64   Temp 97.8 F (36.6 C) (Oral)   Resp 18   Ht 1.702 m ( )   Wt 70.3 kg (155 lb)   SpO2 98%   BMI 24.28 kg/m   Physical Exam  Constitutional: He is oriented to person, place, and time. He appears well-developed and well-nourished.  Non-toxic appearance. No distress.  HENT:  Head: Normocephalic and atraumatic.  Eyes: Pupils are equal, round, and reactive to light. Conjunctivae, EOM and lids are normal.  Neck: Normal range of motion. Neck supple. No tracheal deviation present. No thyroid mass present.  Cardiovascular: Normal rate, regular rhythm and normal heart sounds.  Exam reveals no gallop.   No murmur heard. Pulmonary/Chest: Effort normal and breath sounds normal. No stridor. No respiratory distress. He has no decreased breath sounds. He has no wheezes. He has no rhonchi. He has no rales.        Abdominal: Soft. Normal appearance and bowel sounds are normal. He exhibits no distension. There is no tenderness. There is no rebound and no CVA tenderness.  Musculoskeletal: Normal range of motion. He exhibits no edema or tenderness.  Neurological: He is alert and oriented to person, place, and time. He has normal strength. No cranial nerve deficit or sensory deficit. GCS eye subscore is 4. GCS verbal subscore is 5. GCS motor subscore is 6.  Skin: Skin is warm and dry.  No abrasion and no rash noted.  Psychiatric: He has a normal mood and affect. His speech is normal and behavior is normal.  Nursing note and vitals reviewed.    ED Treatments / Results  Labs (all labs ordered are listed, but only abnormal results are displayed) Labs Reviewed - No data to display  EKG  EKG Interpretation  Date/Time:  Tuesday July 02 2017 17:58:25 EDT Ventricular Rate:  97 PR Interval:    QRS Duration: 79 QT Interval:  330 QTC Calculation: 420 R Axis:   95 Text Interpretation:  Sinus  rhythm Probable left atrial enlargement Borderline right axis deviation Baseline wander in lead(s) V3 since last tracing no significant change Confirmed by Mancel Bale [phone]) on 07/02/2017 6:30:47 PM       Radiology Dg Chest 2 View  Result Date: 07/02/2017 CLINICAL DATA:  36 year old male with chest pain for the past 5 days. EXAM: CHEST  2 VIEW COMPARISON:  No priors. FINDINGS: Lung volumes are normal. No consolidative airspace disease. No pleural effusions. No pneumothorax. No pulmonary nodule or mass noted. Pulmonary vasculature and the cardiomediastinal silhouette are within normal limits. IMPRESSION: No radiographic evidence of acute cardiopulmonary disease. Electronically Signed   By: Trudie Reed M.D.   On: 07/02/2017 18:55    Procedures Procedures (including critical care time)  Medications Ordered in ED Medications - No data to display   Initial Impression / Assessment and Plan / ED Course  I have reviewed the triage vital signs and the nursing notes.  Pertinent labs & imaging results that were available during my care of the patient were reviewed by me and considered in my medical decision making (see chart for details).     Patient muscle skeletal back pain. Chest x-ray without acute findings. She's EKG without acute findings also. Will discharge home  Final Clinical Impressions(s) / ED Diagnoses   Final diagnoses:  None    New Prescriptions New Prescriptions   No medications on file     Lorre Nick, MD 07/02/17 2237

## 2017-08-27 ENCOUNTER — Emergency Department (HOSPITAL_COMMUNITY): Payer: Self-pay

## 2017-08-27 ENCOUNTER — Encounter (HOSPITAL_COMMUNITY): Payer: Self-pay

## 2017-08-27 ENCOUNTER — Emergency Department (HOSPITAL_COMMUNITY)
Admission: EM | Admit: 2017-08-27 | Discharge: 2017-08-28 | Disposition: A | Discharge status: Home / Self Care | Payer: Self-pay | Attending: Emergency Medicine | Admitting: Emergency Medicine

## 2017-08-27 DIAGNOSIS — R079 Chest pain, unspecified: Secondary | ICD-10-CM | POA: Insufficient documentation

## 2017-08-27 LAB — I-STAT TROPONIN, ED
TROPONIN I, POC: 0 ng/mL (ref 0.00–0.08)
Troponin i, poc: 0 ng/mL (ref 0.00–0.08)

## 2017-08-27 LAB — BASIC METABOLIC PANEL
Anion gap: 8 (ref 5–15)
BUN: 6 mg/dL (ref 6–20)
CHLORIDE: 104 mmol/L (ref 101–111)
CO2: 25 mmol/L (ref 22–32)
CREATININE: 0.74 mg/dL (ref 0.61–1.24)
Calcium: 9.1 mg/dL (ref 8.9–10.3)
GFR calc Af Amer: >60 mL/min (ref >60)
GFR calc non Af Amer: >60 mL/min (ref >60)
GLUCOSE: 106 mg/dL — AB (ref 65–99)
Potassium: 3.6 mmol/L (ref 3.5–5.1)
SODIUM: 137 mmol/L (ref 135–145)

## 2017-08-27 LAB — CBC
HCT: 41.4 % (ref 39.0–52.0)
Hemoglobin: 14.9 g/dL (ref 13.0–17.0)
MCH: 31.2 pg (ref 26.0–34.0)
MCHC: 36 g/dL (ref 30.0–36.0)
MCV: 86.6 fL (ref 78.0–100.0)
PLATELETS: 281 10*3/uL (ref 150–400)
RBC: 4.78 MIL/uL (ref 4.22–5.81)
RDW: 12 % (ref 11.5–15.5)
WBC: 9 10*3/uL (ref 4.0–10.5)

## 2017-08-27 MED ORDER — ASPIRIN 81 MG PO CHEW
324.0000 mg | CHEWABLE_TABLET | Freq: Once | ORAL | Status: DC
Start: 2017-08-27 — End: 2017-08-27

## 2017-08-27 NOTE — ED Provider Notes (Signed)
[name]   CSN: [id number] Arrival date & time: 08/27/17  1954     History   Chief Complaint Chief Complaint  Patient presents with  . Chest Pain    HPI Kevin Contreras is a 36 y.o. male with no significant past medical history arrives to the emergency department via EMS complaining of intermittent chest pain that started at 11:00 this morning.  Patient describes the pain as being to the left side of his chest radiating down his left upper extremity.  Describes the pain as sharp with a tingling sensation in the left upper extremity.  States that he has had 5-6 episodes of pain throughout the day, each episode lasting approximately 15 minutes.  Pain seems to be triggered by walking quickly and by smelling paint at work.  Reports associated nausea, lightheadedness, and diaphoresis.  Patient has been seen in the emergency department several times over the past few months for similar symptoms-states this pain and associated symptoms are the same as with these previous evaluations.  [facility] and discharged home with cardiology follow-up.  He was referred to cardiologist Dr.Yedoah and has an appointment scheduled for sometime in January.  Has tried ibuprofen previously for this pain with some relief. Denies fever, chills, cough, or syncope. Translator line used throughout visit.  HPI  History reviewed. No pertinent past medical history.  There are no active problems to display for this patient.   History reviewed. No pertinent surgical history.     Home Medications    Prior to Admission medications   Not on File    Family History History reviewed. No pertinent family history.  Social History Social History   Tobacco Use  . Smoking status: Never Smoker  . Smokeless tobacco: Never Used  Substance Use Topics  . Alcohol use: No    Frequency: Never  . Drug use: No      Allergies   Patient has no allergy information on record.   Review of Systems Review of Systems  Constitutional: Negative for chills.  HENT: Negative for congestion and rhinorrhea.   Respiratory: Negative for cough and shortness of breath.   Cardiovascular: Positive for chest pain. Negative for leg swelling.  Gastrointestinal: Positive for nausea. Negative for abdominal pain, blood in stool, constipation, diarrhea and vomiting.  Genitourinary: Negative for dysuria.  Musculoskeletal: Negative for back pain.  Skin: Negative for rash.  Neurological: Positive for light-headedness. Negative for syncope.  All other systems reviewed and are negative.    Physical Exam Updated Vital Signs BP 131/81   Pulse 79   Resp 14   Ht 5\' 7"  (1.702 m)   Wt 72.6 kg (160 lb)   SpO2 99%   BMI 25.06 kg/m   Physical Exam  Constitutional: He appears well-developed and well-nourished. No distress.  HENT:  Head: Normocephalic and atraumatic.  Eyes: Conjunctivae are normal. Right eye exhibits no discharge. Left eye exhibits no discharge.  Cardiovascular: Normal rate and regular rhythm.  No murmur heard. Pulses:      Radial pulses are 2+ on the right side, and 2+ on the left side.  Pulmonary/Chest: Breath sounds normal. No respiratory distress. He has no wheezes. He has no rales.  Left sided chest wall tenderness to palpation.   Abdominal: Soft. He exhibits no distension. There is no tenderness.  Musculoskeletal:       Right lower leg: He exhibits no edema.  Neurological:  Alert.  Clear speech. No facial droop. CNIII-XII are intact. Bilateral upper and lower extremities' sensation intact to sharp and dull touch. 5/5 grip strength bilaterally. 5/5 plantar and dorsi flexion bilaterally.    Skin: Skin is warm and dry. No rash noted.  Psychiatric: He has a normal mood and affect. His behavior is normal.  Nursing note and vitals reviewed.    ED Treatments / Results  Labs Results for orders  placed or performed during the hospital encounter of 08/27/17  Basic metabolic panel  Result Value Ref Range   Sodium 137 135 - 145 mmol/L   Potassium 3.6 3.5 - 5.1 mmol/L   Chloride 104 101 - 111 mmol/L   CO2 25 22 - 32 mmol/L   Glucose, Bld 106 (H) 65 - 99 mg/dL   BUN 6 6 - 20 mg/dL   Creatinine, Ser 5.780.74 0.61 - 1.24 mg/dL   Calcium 9.1 8.9 - 46.910.3 mg/dL   GFR calc non Af Amer >60 >60 mL/min   GFR calc Af Amer >60 >60 mL/min   Anion gap 8 5 - 15  CBC  Result Value Ref Range   WBC 9.0 4.0 - 10.5 K/uL   RBC 4.78 4.22 - 5.81 MIL/uL   Hemoglobin 14.9 13.0 - 17.0 g/dL   HCT 62.941.4 52.839.0 - 41.352.0 %   MCV 86.6 78.0 - 100.0 fL   MCH 31.2 26.0 - 34.0 pg   MCHC 36.0 30.0 - 36.0 g/dL   RDW 24.412.0 01.011.5 - 27.215.5 %   Platelets 281 150 - 400 K/uL  I-stat troponin, ED  Result Value Ref Range   Troponin i, poc 0.00 0.00 - 0.08 ng/mL   Comment 3          I-stat troponin, ED  Result Value Ref Range   Troponin i, poc 0.00 0.00 - 0.08 ng/mL   Comment 3            EKG  EKG Interpretation  Date/Time:  Tuesday August 27 2017 20:07:17 EST Ventricular Rate:  87 PR Interval:    QRS Duration: 85 QT Interval:  365 QTC Calculation: 440 R Axis:   72 Text Interpretation:  Sinus rhythm no acute ST/T changes No old tracing to compare Confirmed by Pricilla LovelessGoldston, Scott [phone]([id number]) on 08/27/2017 8:20:33 PM       EKG Interpretation  Date/Time:  Tuesday August 27 2017 20:07:17 EST Ventricular Rate:  87 PR Interval:    QRS Duration: 85 QT Interval:  365 QTC Calculation: 440 R Axis:   72 Text Interpretation:  Sinus rhythm no acute ST/T changes No old tracing to compare Confirmed by Pricilla LovelessGoldston, Scott [phone]([id number]) on 08/27/2017 8:20:33 PM       Radiology Dg Chest 2 View  Result Date: 08/27/2017 CLINICAL DATA:  Chest pain. EXAM: CHEST  2 VIEW COMPARISON:  None. FINDINGS: The heart size and mediastinal contours are within normal limits. Both lungs are clear. No pneumothorax or pleural effusion is noted. The  visualized skeletal structures are unremarkable. IMPRESSION: No active cardiopulmonary disease. Electronically Signed   By: Lupita RaiderJames  Green Jr, M.D.   On: 08/27/2017 21:19   Procedures Procedures (including critical care time)  Medications Ordered in ED Medications - No data to display   Initial Impression / Assessment and Plan / ED Course  I have reviewed the triage vital signs and the nursing notes.  Pertinent labs & imaging results that were available during my care of the patient were reviewed by me and considered in my medical decision making (see  chart for details).   Patient presents to the emergency department complaining of intermittent chest pain today.  Patient is nontoxic-appearing, in no apparent distress, with stable vital signs. Considering ACS, Heart Path score of 2.  Patient's initial and repeat EKG without ST/T wave changes.  Troponin negative x 2. Doubt NSTEMI/STEMI. Patient with history of similar pain with several workups, has cardiology follow-up scheduled.  Patient is PERC negative, doubt pulmonary embolism. Patient has stable vital signs, 2+ symmetric radial pulses, no widening of mediastinum on chest x-ray, doubt dissection. Labs unremarkable- no anemia or electrolyte abnormality. Patient's pain is reproducible with palpation which is reassuring. He is hemodynamically stable with stable vitals.  Patient requesting pain medication for at home, he reported improvement in pain with ibuprofen previously, will prescribe naproxen which he is agreeable to.  I discussed results, treatment plan, need for PCP and cardiology follow-up, and return precautions with the patient. Provided opportunity for questions, patient confirmed understanding and is in agreement with plan.   Findings and plan of care discussed with supervising physician Dr. Criss Alvine who is in agreement with plan.   Vitals:   08/27/17 2030 08/27/17 2045  BP: 132/79 131/81  Pulse: 83 79  Resp: 13 14  SpO2: 99% 99%      Final Clinical Impressions(s) / ED Diagnoses   Final diagnoses:  Chest pain, unspecified type    ED Discharge Orders        Ordered    naproxen (NAPROSYN) 500 MG tablet  2 times daily     08/28/17 0013       Petrucelli, Pleas Koch, PA-C 08/28/17 0021  Of note patient received 324 mg of ASA prior to arrival.     Cherly Anderson, PA-C 08/28/17 0024    Pricilla Loveless, MD 08/28/17 1735

## 2017-08-27 NOTE — ED Triage Notes (Signed)
Pt coming from home BIB [facility] due c/o chest pain that radiates down the left arm. Pt states it started today this morning wasn't doing anything when it happened. Ems states this has happened before and they didn't find anything. Ems gave 324 asa but pt denied nitro or nausea medication. Ems also states pt has been having some cold chills and cold symptoms for the last 3 days.

## 2017-08-28 ENCOUNTER — Encounter (HOSPITAL_COMMUNITY): Payer: Self-pay

## 2017-08-28 MED ORDER — NAPROXEN 500 MG PO TABS: 500.0000 mg | ORAL_TABLET | Freq: Two times a day (BID) | ORAL | 0 refills | Status: DC

## 2017-08-28 NOTE — Discharge Instructions (Signed)
You were seen in the emergency department for chest pain.  Your lab work did not show any signs of infection, anemia, or electrolyte abnormality.  Your kidney function was normal.  Your EKG and lab work did not show signs of a heart attack.  Your EKG looks similar to previous EKGs you have had done which is a reassuring sign.  Your chest x-ray did not show any abnormalities.  I have prescribed you Naproxen. Naproxen is a nonsteroidal anti-inflammatory medication that will help with pain and swelling. Be sure to take this medication as prescribed with food, 1 pill every 12 hours,  It should be taken with food, as it can cause stomach upset, and more seriously, stomach bleeding. Do not take other nonsteroidal anti-inflammatory medications with this such as Advil, Motrin, or Aleve.   You will need to follow-up with your cardiology appointment as scheduled in January.  You may also follow-up with a primary care provider, I have included information for primary care provider in the area as well as our clinic.  Return to the emergency department for any new or worsening symptoms including but not limited to worsening chest pain, feeling like your heart is beating fast or funny, or passing out.

## 2017-08-28 NOTE — ED Notes (Signed)
PT states understanding of care given, follow up care, and medication prescribed. PT ambulated from ED to car with a steady gait. 

## 2017-12-19 DIAGNOSIS — Z79899 Other long term (current) drug therapy: Secondary | ICD-10-CM | POA: Insufficient documentation

## 2017-12-19 DIAGNOSIS — R1013 Epigastric pain: Secondary | ICD-10-CM | POA: Insufficient documentation

## 2017-12-20 ENCOUNTER — Encounter: Payer: Self-pay | Admitting: Physician Assistant

## 2017-12-20 ENCOUNTER — Encounter (HOSPITAL_COMMUNITY): Payer: Self-pay

## 2017-12-20 ENCOUNTER — Other Ambulatory Visit: Payer: Self-pay

## 2017-12-20 ENCOUNTER — Emergency Department (HOSPITAL_COMMUNITY)
Admission: EM | Admit: 2017-12-20 | Discharge: 2017-12-20 | Disposition: A | Discharge status: Home / Self Care | Payer: Self-pay | Attending: Emergency Medicine | Admitting: Emergency Medicine

## 2017-12-20 DIAGNOSIS — R1013 Epigastric pain: Secondary | ICD-10-CM

## 2017-12-20 LAB — COMPREHENSIVE METABOLIC PANEL
ALBUMIN: 4.5 g/dL (ref 3.5–5.0)
ALK PHOS: 66 U/L (ref 38–126)
ALT: 33 U/L (ref 17–63)
AST: 27 U/L (ref 15–41)
Anion gap: 6 (ref 5–15)
BUN: 10 mg/dL (ref 6–20)
CALCIUM: 9.2 mg/dL (ref 8.9–10.3)
CO2: 29 mmol/L (ref 22–32)
Chloride: 106 mmol/L (ref 101–111)
Creatinine, Ser: 0.82 mg/dL (ref 0.61–1.24)
GFR calc Af Amer: >60 mL/min (ref >60)
GFR calc non Af Amer: >60 mL/min (ref >60)
GLUCOSE: 143 mg/dL — AB (ref 65–99)
POTASSIUM: 3.8 mmol/L (ref 3.5–5.1)
Sodium: 141 mmol/L (ref 135–145)
TOTAL PROTEIN: 7.4 g/dL (ref 6.5–8.1)
Total Bilirubin: 0.5 mg/dL (ref 0.3–1.2)

## 2017-12-20 LAB — URINALYSIS, ROUTINE W REFLEX MICROSCOPIC
BILIRUBIN URINE: NEGATIVE
Glucose, UA: NEGATIVE mg/dL
HGB URINE DIPSTICK: NEGATIVE
KETONES UR: NEGATIVE mg/dL
Leukocytes, UA: NEGATIVE
NITRITE: NEGATIVE
Protein, ur: NEGATIVE mg/dL
Specific Gravity, Urine: 1.02 (ref 1.005–1.030)
pH: 5 (ref 5.0–8.0)

## 2017-12-20 LAB — CBC
HEMATOCRIT: 41.5 % (ref 39.0–52.0)
Hemoglobin: 15 g/dL (ref 13.0–17.0)
MCH: 31.7 pg (ref 26.0–34.0)
MCHC: 36.1 g/dL — AB (ref 30.0–36.0)
MCV: 87.7 fL (ref 78.0–100.0)
Platelets: 287 10*3/uL (ref 150–400)
RBC: 4.73 MIL/uL (ref 4.22–5.81)
RDW: 12.1 % (ref 11.5–15.5)
WBC: 7.7 10*3/uL (ref 4.0–10.5)

## 2017-12-20 LAB — LIPASE, BLOOD: Lipase: 30 U/L (ref 11–51)

## 2017-12-20 MED ORDER — PANTOPRAZOLE SODIUM 40 MG IV SOLR: 40.0000 mg | Freq: Once | INTRAVENOUS | Status: DC

## 2017-12-20 MED ORDER — SUCRALFATE 1 G PO TABS
1.0000 g | ORAL_TABLET | Freq: Once | ORAL | Status: AC
  Administered 2017-12-20: 1 g via ORAL
  Filled 2017-12-20: qty 1

## 2017-12-20 MED ORDER — GI COCKTAIL ~~LOC~~
30.0000 mL | Freq: Once | ORAL | Status: AC
  Administered 2017-12-20: 30 mL via ORAL
  Filled 2017-12-20: qty 30

## 2017-12-20 MED ORDER — SUCRALFATE 1 G PO TABS: 1.0000 g | ORAL_TABLET | Freq: Three times a day (TID) | ORAL | 0 refills | Status: AC

## 2017-12-20 NOTE — ED Provider Notes (Signed)
[facility]-EMERGENCY DEPT Provider Note: Lowella Dell, MD, FACEP  CSN: [id number] MRN: [id number] ARRIVAL: 12/19/17 at 2229 ROOM: WA14/WA14   CHIEF COMPLAINT  Abdominal Pain   HISTORY OF PRESENT ILLNESS  12/20/17 3:07 AM Nameer Summer is a 37 y.o. male with a 2-week history of abdominal pain.  The pain is located in the epigastrium and left upper quadrant.  He describes it as burning and a sensation that he is full.  It is somewhat worse after eating.  He cannot tell me how severe it is.  There is no associated nausea or vomiting.  He was worked up for chest pain by cardiology in February and diagnosed with GERD; he has been on Protonix since February without control of his symptoms.  He denies worsening when lying supine.  He denies an acidic taste in his throat.   History reviewed. No pertinent past medical history.  History reviewed. No pertinent surgical history.  History reviewed. No pertinent family history.  Social History   Tobacco Use  . Smoking status: Never Smoker  . Smokeless tobacco: Never Used  Substance Use Topics  . Alcohol use: No    Frequency: Never  . Drug use: No    Prior to Admission medications   Medication Sig Start Date End Date Taking? Authorizing Provider  cetirizine (ZYRTEC) 10 MG tablet Take 1 tablet (10 mg total) by mouth daily. Patient not taking: Reported on 03/07/2017 10/27/14   Collene Gobble, MD  clotrimazole (LOTRIMIN) 1 % cream Apply 1 application topically 2 (two) times daily. Patient not taking: Reported on 08/28/2015 10/27/14   Collene Gobble, MD  cyclobenzaprine (FLEXERIL) 10 MG tablet Take 1 tablet (10 mg total) by mouth 3 (three) times daily as needed for muscle spasms. Patient not taking: Reported on 07/02/2017 03/07/17   Shaune Pollack, MD  HYDROcodone-acetaminophen (NORCO/VICODIN) 5-325 MG tablet Take 1-2 tablets by mouth every 4 (four) hours as needed for moderate pain or severe pain. Patient not taking: Reported on 07/02/2017 03/07/17    Shaune Pollack, MD  ibuprofen (ADVIL,MOTRIN) 400 MG tablet Take 400 mg by mouth every 6 (six) hours as needed.    [provider]  ibuprofen (ADVIL,MOTRIN) 600 MG tablet Take 1 tablet (600 mg total) by mouth every 6 (six) hours as needed. 07/02/17   Lorre Nick, MD  methocarbamol (ROBAXIN-750) 750 MG tablet Take 1 tablet (750 mg total) by mouth 4 (four) times daily. 07/02/17   Lorre Nick, MD  naproxen (NAPROSYN) 500 MG tablet Take 1 tablet (500 mg total) by mouth 2 (two) times daily. 08/28/17   Petrucelli, Samantha R, PA-C    Allergies Ibuprofen   REVIEW OF SYSTEMS  Negative except as noted here or in the History of Present Illness.   PHYSICAL EXAMINATION  Initial Vital Signs Blood pressure (!) 130/93, pulse 68, temperature 97.6 F (36.4 C), temperature source Oral, resp. rate 16, SpO2 100 %.  Examination General: Well-developed, well-nourished male in no acute distress; appearance consistent with age of record HENT: normocephalic; atraumatic Eyes: pupils equal, round and reactive to light; extraocular muscles intact Neck: supple Heart: regular rate and rhythm Lungs: clear to auscultation bilaterally Abdomen: soft; nondistended; epigastric and left upper quadrant tenderness; no masses or hepatosplenomegaly; bowel sounds present Extremities: No deformity; full range of motion; pulses normal Neurologic: Awake, alert; motor function intact in all extremities and symmetric; no facial droop Skin: Warm and dry Psychiatric: Normal mood and affect   RESULTS  Summary of this visit's results, reviewed by  myself:   EKG Interpretation  Date/Time:    Ventricular Rate:    PR Interval:    QRS Duration:   QT Interval:    QTC Calculation:   R Axis:     Text Interpretation:        Laboratory Studies: Results for orders placed or performed during the hospital encounter of 12/20/17 (from the past 24 hour(s))  Lipase, blood     Status: None   Collection Time: 12/20/17   1:04 AM  Result Value Ref Range   Lipase 30 11 - 51 U/L  Comprehensive metabolic panel     Status: Abnormal   Collection Time: 12/20/17  1:04 AM  Result Value Ref Range   Sodium 141 135 - 145 mmol/L   Potassium 3.8 3.5 - 5.1 mmol/L   Chloride 106 101 - 111 mmol/L   CO2 29 22 - 32 mmol/L   Glucose, Bld 143 (H) 65 - 99 mg/dL   BUN 10 6 - 20 mg/dL   Creatinine, Ser 1.610.82 0.61 - 1.24 mg/dL   Calcium 9.2 8.9 - 09.610.3 mg/dL   Total Protein 7.4 6.5 - 8.1 g/dL   Albumin 4.5 3.5 - 5.0 g/dL   AST 27 15 - 41 U/L   ALT 33 17 - 63 U/L   Alkaline Phosphatase 66 38 - 126 U/L   Total Bilirubin 0.5 0.3 - 1.2 mg/dL   GFR calc non Af Amer >60 >60 mL/min   GFR calc Af Amer >60 >60 mL/min   Anion gap 6 5 - 15  CBC     Status: Abnormal   Collection Time: 12/20/17  1:04 AM  Result Value Ref Range   WBC 7.7 4.0 - 10.5 K/uL   RBC 4.73 4.22 - 5.81 MIL/uL   Hemoglobin 15.0 13.0 - 17.0 g/dL   HCT 04.541.5 40.939.0 - 81.152.0 %   MCV 87.7 78.0 - 100.0 fL   MCH 31.7 26.0 - 34.0 pg   MCHC 36.1 (H) 30.0 - 36.0 g/dL   RDW 91.412.1 78.211.5 - 95.615.5 %   Platelets 287 150 - 400 K/uL  Urinalysis, Routine w reflex microscopic     Status: None   Collection Time: 12/20/17  2:53 AM  Result Value Ref Range   Color, Urine YELLOW YELLOW   APPearance CLEAR CLEAR   Specific Gravity, Urine 1.020 1.005 - 1.030   pH 5.0 5.0 - 8.0   Glucose, UA NEGATIVE NEGATIVE mg/dL   Hgb urine dipstick NEGATIVE NEGATIVE   Bilirubin Urine NEGATIVE NEGATIVE   Ketones, ur NEGATIVE NEGATIVE mg/dL   Protein, ur NEGATIVE NEGATIVE mg/dL   Nitrite NEGATIVE NEGATIVE   Leukocytes, UA NEGATIVE NEGATIVE   Imaging Studies: No results found.  ED COURSE  Nursing notes and initial vitals signs, including pulse oximetry, reviewed.  Vitals:   12/20/17 0044 12/20/17 0256 12/20/17 0320  BP: 136/90 (!) 130/93   Pulse: 80 68   Resp: 20 16   Temp: 98.4 F (36.9 C) 97.6 F (36.4 C)   TempSrc: Oral Oral   SpO2: 100% 100%   Weight:   73.9 kg (163 lb)  Height:    5\' 6"  (1.676 m)   3:57 AM No change with oral Carafate.  4:52 AM Some improvement with GI cocktail.  The patient's labs are reassuring.  I suspect he has gastritis or early ulcer.  We will add Carafate to his regimen and refer to gastroenterology.  PROCEDURES    ED DIAGNOSES     ICD-10-CM  1. Epigastric pain R10.13        Jabril Pursell, Jonny Ruiz, MD 12/20/17 [phone]

## 2017-12-20 NOTE — ED Triage Notes (Signed)
Pt complains of a burning pain in the bottom of his stomach, denies vomiting or diarrhea

## 2018-01-02 ENCOUNTER — Ambulatory Visit: Payer: Self-pay | Admitting: Physician Assistant

## 2018-04-30 IMAGING — DX DG CHEST 2V
2 series · 2 of 2 positions shown · non-contrast
Comparison: None.

CLINICAL DATA: Chest pain.

EXAM:
CHEST  2 VIEW

[chest pa]
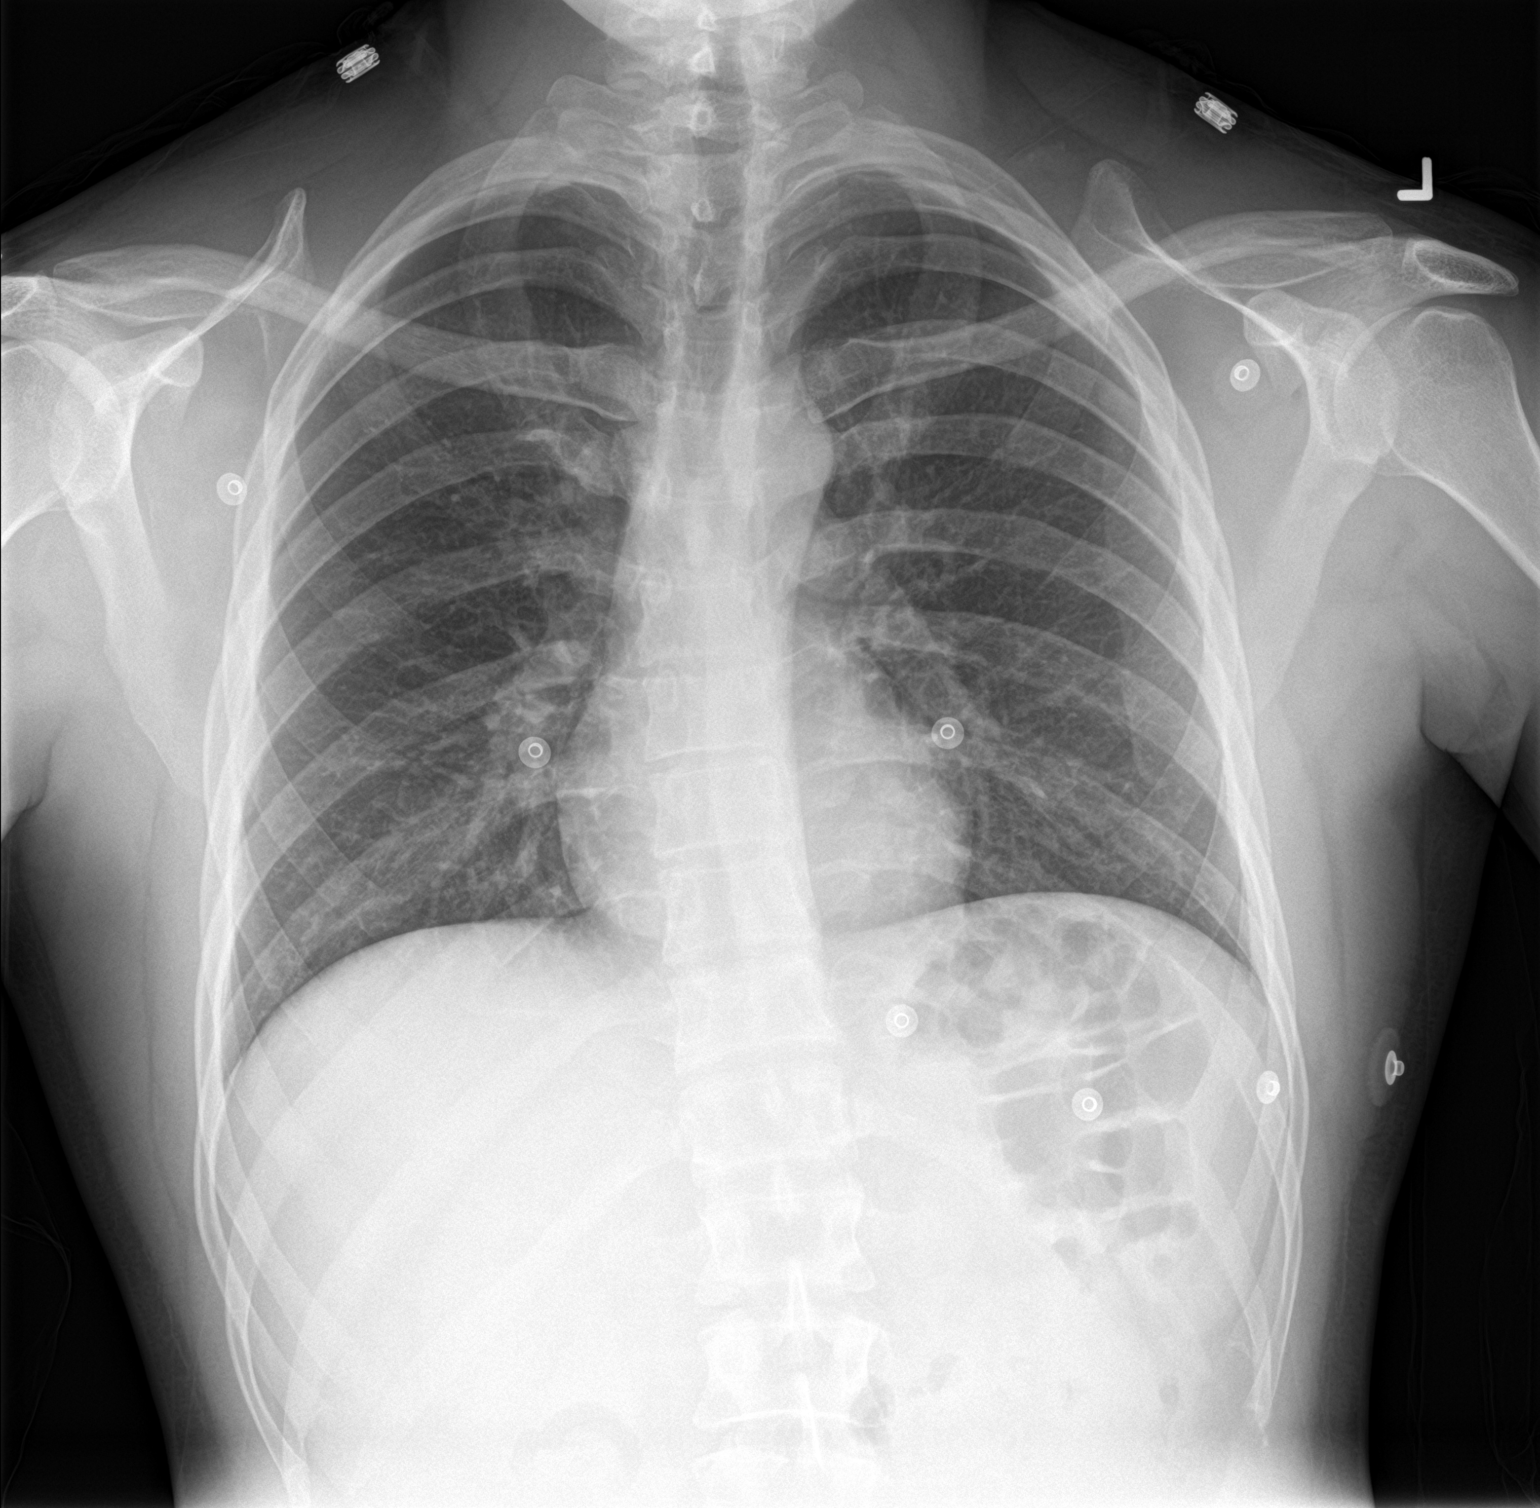

[chest lat]
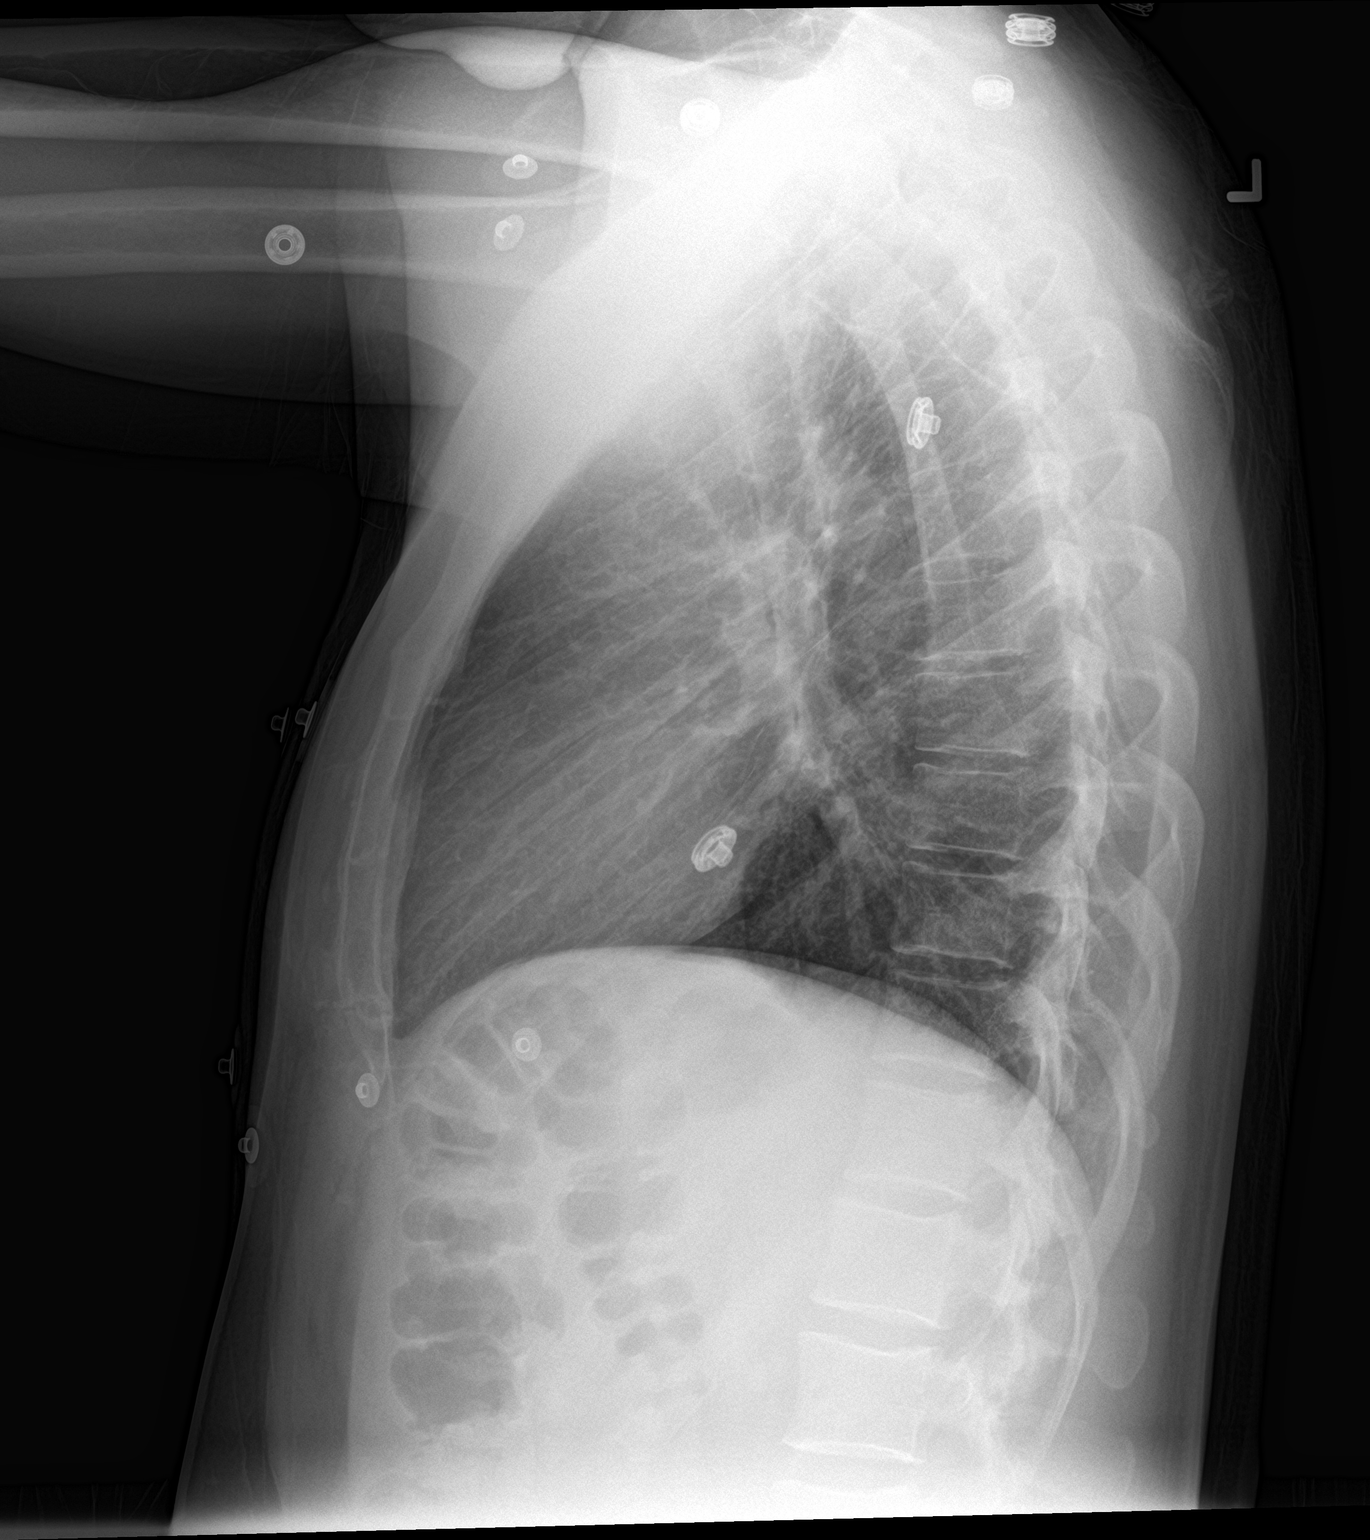

[2 of 2 positions shown; findings below may reference images not displayed]

FINDINGS: The heart size and mediastinal contours are within normal limits.
Both lungs are clear. No pneumothorax or pleural effusion is noted.
The visualized skeletal structures are unremarkable.
IMPRESSION: No active cardiopulmonary disease.
